# Patient Record
Sex: Male | Born: 1995 | Race: Black or African American | Hispanic: No | Marital: Single | Smoking: Former smoker
Health system: Southern US, Community
[De-identification: ages and names within clinical notes are randomized; demographics above are authoritative.]

## PROBLEM LIST (undated history)

## (undated) DIAGNOSIS — L0291 Cutaneous abscess, unspecified: Secondary | ICD-10-CM

## (undated) DIAGNOSIS — L309 Dermatitis, unspecified: Secondary | ICD-10-CM

## (undated) DIAGNOSIS — F909 Attention-deficit hyperactivity disorder, unspecified type: Secondary | ICD-10-CM

---
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
## 2015-02-28 ENCOUNTER — Encounter (HOSPITAL_COMMUNITY): Payer: Self-pay

## 2015-02-28 ENCOUNTER — Emergency Department (HOSPITAL_COMMUNITY)
Admission: EM | Admit: 2015-02-28 | Discharge: 2015-02-28 | Disposition: A | Discharge status: Home / Self Care | Payer: Medicaid Other | Attending: Emergency Medicine | Admitting: Emergency Medicine

## 2015-02-28 DIAGNOSIS — Z792 Long term (current) use of antibiotics: Secondary | ICD-10-CM | POA: Diagnosis not present

## 2015-02-28 DIAGNOSIS — Z720 Tobacco use: Secondary | ICD-10-CM | POA: Insufficient documentation

## 2015-02-28 DIAGNOSIS — J209 Acute bronchitis, unspecified: Secondary | ICD-10-CM | POA: Diagnosis not present

## 2015-02-28 DIAGNOSIS — R05 Cough: Secondary | ICD-10-CM | POA: Diagnosis present

## 2015-02-28 DIAGNOSIS — F909 Attention-deficit hyperactivity disorder, unspecified type: Secondary | ICD-10-CM | POA: Insufficient documentation

## 2015-02-28 DIAGNOSIS — Z79899 Other long term (current) drug therapy: Secondary | ICD-10-CM | POA: Insufficient documentation

## 2015-02-28 DIAGNOSIS — Z872 Personal history of diseases of the skin and subcutaneous tissue: Secondary | ICD-10-CM | POA: Diagnosis not present

## 2015-02-28 DIAGNOSIS — J40 Bronchitis, not specified as acute or chronic: Secondary | ICD-10-CM

## 2015-02-28 MED ORDER — AZITHROMYCIN 250 MG PO TABS: 250.0000 mg | ORAL_TABLET | Freq: Every day | ORAL | Status: AC

## 2015-02-28 MED ORDER — ALBUTEROL SULFATE HFA 108 (90 BASE) MCG/ACT IN AERS: 1.0000 | INHALATION_SPRAY | Freq: Four times a day (QID) | RESPIRATORY_TRACT | Status: DC | PRN

## 2015-02-28 MED ORDER — PREDNISONE 20 MG PO TABS
60.0000 mg | ORAL_TABLET | Freq: Once | ORAL | Status: AC
  Administered 2015-02-28: 60 mg via ORAL
  Filled 2015-02-28: qty 3

## 2015-02-28 MED ORDER — IPRATROPIUM-ALBUTEROL 0.5-2.5 (3) MG/3ML IN SOLN
3.0000 mL | Freq: Once | RESPIRATORY_TRACT | Status: AC
  Administered 2015-02-28: 3 mL via RESPIRATORY_TRACT
  Filled 2015-02-28: qty 3

## 2015-02-28 MED ORDER — PREDNISONE 20 MG PO TABS: 40.0000 mg | ORAL_TABLET | Freq: Every day | ORAL | Status: DC

## 2015-02-28 NOTE — Discharge Instructions (Signed)
Take azithromycin as directed until gone. Take prednisone as directed until gone. Use inhaler as needed for wheezing. Refer to attached documents for more information.

## 2015-02-28 NOTE — ED Notes (Signed)
Treatment complete.

## 2015-02-28 NOTE — ED Provider Notes (Signed)
CSN: [id number]     Arrival date & time 02/28/15  0910 History  By signing my name below, I, Elon Spanner, attest that this documentation has been prepared under the direction and in the presence of [facility], PA-C. Electronically Signed: Elon Spanner ED Scribe. 02/28/2015. 9:16 AM.   Chief Complaint  Patient presents with  . Cough   The history is provided by the patient. No language interpreter was used.    HPI Comments: Allen Hayes is a 19 y.o. male who presents to the Emergency Department complaining of constant, unchanged mild SOB with associated nonproductive cough onset 1 week ago.  Associated symptoms include CP only with coughing.  He reports he was seen for that same complaint 1 week ago in [facility] Emergency Department where he received a negative CXR.  He was told his complaint was due to asthma, however, he had never been dx'd with asthma previously.  He reports a family hx of asthma in the aunt.  He denies fever.     Past Medical History  Diagnosis Date  . Eczema   . ADHD (attention deficit hyperactivity disorder)   . Abscess     mul;tiple skin abscesses   No past surgical history on file. No family history on file. Social History  Substance Use Topics  . Smoking status: Current Every Day Smoker    Types: Cigarettes  . Smokeless tobacco: Not on file  . Alcohol Use: Yes     Comment: occ    Review of Systems  Constitutional: Negative for fever.  Respiratory: Positive for cough and shortness of breath.   All other systems reviewed and are negative.     Allergies  Review of patient's allergies indicates no known allergies.  Home Medications   Prior to Admission medications   Medication Sig Start Date End Date Taking? Authorizing Provider  cephALEXin (KEFLEX) 500 MG capsule Take 1 capsule (500 mg total) by mouth 4 (four) times daily. 12/03/13   Junius Finner, PA-C  cetirizine (ZYRTEC) 10 MG tablet Take 1 tablet (10 mg total) by mouth daily.  11/01/12   Nicole Pisciotta, PA-C  Emollient (EUCERIN) lotion Apply 1 Bottle topically as needed for dry skin.    Historical Provider, MD  lisdexamfetamine (VYVANSE) 30 MG capsule Take 30 mg by mouth daily.    Historical Provider, MD  oxyCODONE-acetaminophen (PERCOCET/ROXICET) 5-325 MG per tablet Take 1-2 tablets by mouth every 6 (six) hours as needed for moderate pain or severe pain. 12/03/13   Junius Finner, PA-C  traMADol (ULTRAM) 50 MG tablet Take 1 tablet (50 mg total) by mouth every 6 (six) hours as needed. 07/28/14   Kathrynn Speed, PA-C   There were no vitals taken for this visit. Physical Exam  Constitutional: He is oriented to person, place, and time. He appears well-developed and well-nourished. No distress.  HENT:  Head: Normocephalic and atraumatic.  Eyes: Conjunctivae and EOM are normal.  Neck: Neck supple. No tracheal deviation present.  Cardiovascular: Normal rate and regular rhythm.  Exam reveals no gallop and no friction rub.   No murmur heard. Pulmonary/Chest: Effort normal. No respiratory distress. He has wheezes. He has no rales. He exhibits no tenderness.  Inspiratory and expiratory rhonchi in all lung fields. Expiratory wheezing.   Abdominal: Soft. There is no tenderness.  Musculoskeletal: Normal range of motion.  Neurological: He is alert and oriented to person, place, and time.  Speech is goal-oriented. Moves limbs without ataxia.   Skin: Skin is warm and dry.  Psychiatric: He has a normal mood and affect. His behavior is normal.  Nursing note and vitals reviewed.   ED Course  Procedures (including critical care time)  DIAGNOSTIC STUDIES: Oxygen Saturation is 95% on RA, adequate by my interpretation.    COORDINATION OF CARE:  9:45 AM Discussed treatment plan with patient at bedside.  Patient acknowledges and agrees with plan.    Labs Review Labs Reviewed - No data to display  Imaging Review No results found. I have personally reviewed and evaluated these  images and lab results as part of my medical decision-making.   EKG Interpretation None      MDM   Final diagnoses:  Bronchitis    11:02 AM Patient's lung sounds improved with duoneb. Patient will be treated with prednisone taper, azithromycin, and albuterol inhaler. Vitals stable and patient afebrile. Patient stable for discharge.   I personally performed the services described in this documentation, which was scribed in my presence. The recorded information has been reviewed and is accurate.    Emilia BeckKaitlyn Mayu Ronk, PA-C 02/28/15 1104  Gilda Creasehristopher J Pollina, MD 02/28/15 1531

## 2015-02-28 NOTE — ED Notes (Signed)
Pt. Developed a congested cough last night.  Denies any fevers, n/v/d.  Does have asthma, but does not have any inhalers.  Pt. Is allergic to cats and dogs and has recently been around them.  Skin is warm and dry.

## 2016-03-16 ENCOUNTER — Encounter (HOSPITAL_COMMUNITY): Payer: Self-pay | Admitting: Family Medicine

## 2016-03-16 ENCOUNTER — Emergency Department (HOSPITAL_COMMUNITY)
Admission: EM | Admit: 2016-03-16 | Discharge: 2016-03-16 | Disposition: A | Discharge status: Home / Self Care | Payer: Medicaid Other | Attending: Emergency Medicine | Admitting: Emergency Medicine

## 2016-03-16 ENCOUNTER — Emergency Department (HOSPITAL_COMMUNITY): Payer: Medicaid Other

## 2016-03-16 DIAGNOSIS — Z87891 Personal history of nicotine dependence: Secondary | ICD-10-CM | POA: Diagnosis not present

## 2016-03-16 DIAGNOSIS — F909 Attention-deficit hyperactivity disorder, unspecified type: Secondary | ICD-10-CM | POA: Insufficient documentation

## 2016-03-16 DIAGNOSIS — J4521 Mild intermittent asthma with (acute) exacerbation: Secondary | ICD-10-CM

## 2016-03-16 DIAGNOSIS — R0602 Shortness of breath: Secondary | ICD-10-CM | POA: Diagnosis present

## 2016-03-16 MED ORDER — PREDNISONE 20 MG PO TABS
60.0000 mg | ORAL_TABLET | Freq: Once | ORAL | Status: AC
  Administered 2016-03-16: 60 mg via ORAL
  Filled 2016-03-16: qty 3

## 2016-03-16 MED ORDER — PREDNISONE 20 MG PO TABS: ORAL_TABLET | ORAL | 0 refills | Status: DC

## 2016-03-16 MED ORDER — ALBUTEROL SULFATE (2.5 MG/3ML) 0.083% IN NEBU
5.0000 mg | INHALATION_SOLUTION | Freq: Once | RESPIRATORY_TRACT | Status: AC
  Administered 2016-03-16: 5 mg via RESPIRATORY_TRACT
  Filled 2016-03-16: qty 6

## 2016-03-16 MED ORDER — ALBUTEROL SULFATE HFA 108 (90 BASE) MCG/ACT IN AERS: 1.0000 | INHALATION_SPRAY | Freq: Four times a day (QID) | RESPIRATORY_TRACT | 0 refills | Status: AC | PRN

## 2016-03-16 NOTE — ED Provider Notes (Signed)
[facility]-EMERGENCY DEPT Provider Note   CSN: [id number] Arrival date & time: 03/16/16  4098 By signing my name below, I, Allen Hayes, attest that this documentation has been prepared under the direction and in the presence of Allen Trueba, MD . Electronically Signed: Levon Hayes, Scribe. 03/16/2016. 3:43 AM.   History   Chief Complaint Chief Complaint  Patient presents with  . Shortness of Breath    HPI Allen Hayes is a 20 y.o. male who presents to the Emergency Department complaining of asthma exacerbation which began at 9:30 yesterday morning. Pt states he was standing near someone who was smoking a cigarette, causing him to wheeze. No alleviating or modifying factors noted. He states he has been to the hospital for the same multiple times recently. Pt denies any other symptoms and has no other complaints at this time.   The history is provided by the patient. No language interpreter was used.  Wheezing   This is a recurrent problem. The current episode started yesterday. The problem occurs constantly. The problem has been rapidly improving. Pertinent negatives include no chest pain, no fever, no abdominal pain, no coryza and no cough. The problem's precipitants include smoke. He has tried nothing for the symptoms. The treatment provided no relief. He has had no prior hospitalizations. He has had prior ED visits. He has had no prior ICU admissions. His past medical history does not include pneumonia or PE.   Past Medical History:  Diagnosis Date  . Abscess    mul;tiple skin abscesses  . ADHD (attention deficit hyperactivity disorder)   . Eczema    There are no active problems to display for this patient.   History reviewed. No pertinent surgical history.   Home Medications    Prior to Admission medications   Medication Sig Start Date End Date Taking? Authorizing Provider  albuterol (PROVENTIL HFA;VENTOLIN HFA) 108 (90 BASE) MCG/ACT inhaler Inhale 1-2 puffs into the  lungs every 6 (six) hours as needed for wheezing or shortness of breath. 02/28/15   Kaitlyn Szekalski, PA-C  azithromycin (ZITHROMAX Z-PAK) 250 MG tablet Take 1 tablet (250 mg total) by mouth daily. 500mg  PO day 1, then 250mg  PO days 205 02/28/15   Kaitlyn Szekalski, PA-C  cephALEXin (KEFLEX) 500 MG capsule Take 1 capsule (500 mg total) by mouth 4 (four) times daily. 12/03/13   Junius Finner, PA-C  cetirizine (ZYRTEC) 10 MG tablet Take 1 tablet (10 mg total) by mouth daily. 11/01/12   Nicole Pisciotta, PA-C  Emollient (EUCERIN) lotion Apply 1 Bottle topically as needed for dry skin.    Historical Provider, MD  lisdexamfetamine (VYVANSE) 30 MG capsule Take 30 mg by mouth daily.    Historical Provider, MD  oxyCODONE-acetaminophen (PERCOCET/ROXICET) 5-325 MG per tablet Take 1-2 tablets by mouth every 6 (six) hours as needed for moderate pain or severe pain. 12/03/13   Junius Finner, PA-C  predniSONE (DELTASONE) 20 MG tablet Take 2 tablets (40 mg total) by mouth daily. Take 40 mg by mouth daily for 3 days, then 20mg  by mouth daily for 3 days, then 10mg  daily for 3 days 02/28/15   Emilia Beck, PA-C  traMADol (ULTRAM) 50 MG tablet Take 1 tablet (50 mg total) by mouth every 6 (six) hours as needed. 07/28/14   Kathrynn Speed, PA-C    Family History History reviewed. No pertinent family history.  Social History Social History  Substance Use Topics  . Smoking status: Former Smoker    Types: Cigarettes  . Smokeless tobacco: Never  Used  . Alcohol use Yes     Comment: occ    Allergies   Patient has no known allergies.  Review of Systems Review of Systems  Constitutional: Negative for fever.  Respiratory: Positive for wheezing. Negative for cough, shortness of breath and stridor.   Cardiovascular: Negative for chest pain, palpitations and leg swelling.  Gastrointestinal: Negative for abdominal pain.  All other systems reviewed and are negative.  Physical Exam Updated Vital Signs BP 121/80    Pulse 92   Temp 97.8 F (36.6 C) (Oral)   Resp 14   Ht 5\' 10"  (1.778 m)   Wt 230 lb (104.3 kg)   SpO2 94%   BMI 33.00 kg/m   Physical Exam  Constitutional: He is oriented to person, place, and time. He appears well-developed and well-nourished. No distress.  HENT:  Head: Normocephalic and atraumatic.  Mouth/Throat: Oropharynx is clear and moist. No oropharyngeal exudate.  Moist mucous membranes   Eyes: Pupils are equal, round, and reactive to light. Right conjunctiva is injected. Left conjunctiva is injected.  Neck: Normal range of motion. Neck supple. No JVD present.  Trachea midline No bruit  Cardiovascular: Normal rate, regular rhythm, normal heart sounds and intact distal pulses.   Pulmonary/Chest: Effort normal and breath sounds normal. No stridor. No respiratory distress. He has no wheezes. He has no rales.  Abdominal: Soft. Bowel sounds are normal. He exhibits no distension and no mass. There is no tenderness. There is no guarding.  Musculoskeletal: Normal range of motion.  All compartments are soft  Neurological: He is alert and oriented to person, place, and time. He has normal reflexes.  Skin: Skin is warm and dry. Capillary refill takes less than 2 seconds.  Psychiatric: He has a normal mood and affect. His behavior is normal.  Nursing note and vitals reviewed.  ED Treatments / Results   Vitals:   03/16/16 0245 03/16/16 0314  BP: 107/81 121/80  Pulse: 99 92  Resp: 22 14  Temp: 97.8 F (36.6 C)     DIAGNOSTIC STUDIES: Oxygen Saturation is 94% on RA, normal by my interpretation.    COORDINATION OF CARE:  3:44 AM Will order prednisone. Discussed treatment plan with pt at bedside and pt agreed to plan.     EKG Interpretation  Date/Time:  Tuesday March 16 2016 03:13:22 EST Ventricular Rate:  92 PR Interval:    QRS Duration: 88 QT Interval:  335 QTC Calculation: 415 R Axis:   82 Text Interpretation:  Sinus rhythm Confirmed by [facility]  MD, Adriena Manfre  ([id number]) on 03/16/2016 3:42:34 AM       Radiology Dg Chest 2 View  Result Date: 03/16/2016 CLINICAL DATA:  Shortness of breath and wheezing for 24 hours, worse this morning. EXAM: CHEST  2 VIEW COMPARISON:  06/08/2014 FINDINGS: The lungs are clear. The pulmonary vasculature is normal. Heart size is normal. Hilar and mediastinal contours are unremarkable. There is no pleural effusion. IMPRESSION: No active cardiopulmonary disease. Electronically Signed   By: Ellery Plunkaniel R Mitchell M.D.   On: 03/16/2016 03:10    Procedures Procedures (including critical care time)  Medications Ordered in ED Medications  albuterol (PROVENTIL) (2.5 MG/3ML) 0.083% nebulizer solution 5 mg (5 mg Nebulization Given 03/16/16 0327)  predniSONE (DELTASONE) tablet 60 mg (60 mg Oral Given 03/16/16 0413)      Final Clinical Impressions(s) / ED Diagnoses  Asthma attack:All questions answered to patient's satisfaction. Based on history and exam patient has been appropriately medically screened and emergency conditions  excluded. Patient is stable for discharge at this time. Follow up with your PMD for recheck in 2 days and strict return precautions given. If symptoms are not improved or progressing call hand surgery to schedule a follow up.    I personally performed the services described in this documentation, which was scribed in my presence. The recorded information has been reviewed and is accurate.      Cy BlamerApril Taziah Difatta, MD 03/16/16 [phone]

## 2016-03-16 NOTE — ED Triage Notes (Signed)
Patient is complaining of shortness of breath since 9:30 yesterday morning. Pt was standing around another person who was smoking a cigarette.

## 2016-03-16 NOTE — ED Notes (Signed)
Patient transported to X-ray 

## 2018-01-20 ENCOUNTER — Other Ambulatory Visit: Payer: Self-pay

## 2018-01-20 ENCOUNTER — Emergency Department (HOSPITAL_COMMUNITY)
Admission: EM | Admit: 2018-01-20 | Discharge: 2018-01-20 | Disposition: A | Discharge status: Home / Self Care | Payer: Self-pay | Attending: Emergency Medicine | Admitting: Emergency Medicine

## 2018-01-20 ENCOUNTER — Emergency Department (HOSPITAL_COMMUNITY): Payer: Self-pay

## 2018-01-20 ENCOUNTER — Encounter (HOSPITAL_COMMUNITY): Payer: Self-pay

## 2018-01-20 DIAGNOSIS — Z87891 Personal history of nicotine dependence: Secondary | ICD-10-CM | POA: Insufficient documentation

## 2018-01-20 DIAGNOSIS — R062 Wheezing: Secondary | ICD-10-CM

## 2018-01-20 DIAGNOSIS — J069 Acute upper respiratory infection, unspecified: Secondary | ICD-10-CM | POA: Insufficient documentation

## 2018-01-20 MED ORDER — ALBUTEROL SULFATE (2.5 MG/3ML) 0.083% IN NEBU
5.0000 mg | INHALATION_SOLUTION | Freq: Once | RESPIRATORY_TRACT | Status: AC
  Administered 2018-01-20: 5 mg via RESPIRATORY_TRACT
  Filled 2018-01-20: qty 6

## 2018-01-20 MED ORDER — PREDNISONE 20 MG PO TABS
60.0000 mg | ORAL_TABLET | Freq: Once | ORAL | Status: AC
  Administered 2018-01-20: 60 mg via ORAL
  Filled 2018-01-20: qty 3

## 2018-01-20 MED ORDER — PREDNISONE 20 MG PO TABS: ORAL_TABLET | ORAL | 0 refills | Status: AC

## 2018-01-20 MED ORDER — ALBUTEROL SULFATE HFA 108 (90 BASE) MCG/ACT IN AERS
2.0000 | INHALATION_SPRAY | RESPIRATORY_TRACT | Status: DC | PRN
  Administered 2018-01-20: 2 via RESPIRATORY_TRACT
  Filled 2018-01-20: qty 6.7

## 2018-01-20 NOTE — ED Provider Notes (Addendum)
[facility]-EMERGENCY DEPT Provider Note   CSN: [id number] Arrival date & time: 01/20/18  1617     History   Chief Complaint Chief Complaint  Patient presents with  . Shortness of Breath    HPI Allen Hayes is a 22 y.o. male.  Patient is a 22 year old male with history of ADHD and eczema who presents with cough and wheezing.  He has had cold symptoms since yesterday with nasal congestion coughing and wheezing.  He was feeling short of breath on arrival.  He has some discomfort in the center of his chest when he breathes but no other chest pain.  No leg pain or swelling.  He states he has had wheezing before with colds.  He has no diagnosis of asthma but had an inhaler at home which she tried to use but it was apparently out.  He denies any recent fevers.     Past Medical History:  Diagnosis Date  . Abscess    mul;tiple skin abscesses  . ADHD (attention deficit hyperactivity disorder)   . Eczema     There are no active problems to display for this patient.   History reviewed. No pertinent surgical history.      Home Medications    Prior to Admission medications   Medication Sig Start Date End Date Taking? Authorizing Provider  albuterol (PROVENTIL HFA;VENTOLIN HFA) 108 (90 Base) MCG/ACT inhaler Inhale 1-2 puffs into the lungs every 6 (six) hours as needed for wheezing or shortness of breath. Patient not taking: Reported on 01/20/2018 03/16/16   Palumbo, April, MD  azithromycin (ZITHROMAX Z-PAK) 250 MG tablet Take 1 tablet (250 mg total) by mouth daily. 500mg  PO day 1, then 250mg  PO days 205 Patient not taking: Reported on 03/16/2016 02/28/15   Emilia Beck, PA-C  cephALEXin (KEFLEX) 500 MG capsule Take 1 capsule (500 mg total) by mouth 4 (four) times daily. Patient not taking: Reported on 03/16/2016 12/03/13   Lurene Shadow, PA-C  cetirizine (ZYRTEC) 10 MG tablet Take 1 tablet (10 mg total) by mouth daily. Patient not taking: Reported on  03/16/2016 11/01/12   Pisciotta, Joni Reining, PA-C  oxyCODONE-acetaminophen (PERCOCET/ROXICET) 5-325 MG per tablet Take 1-2 tablets by mouth every 6 (six) hours as needed for moderate pain or severe pain. Patient not taking: Reported on 03/16/2016 12/03/13   Lurene Shadow, PA-C  predniSONE (DELTASONE) 20 MG tablet 2 tabs po daily x 4 days 01/20/18   Rolan Bucco, MD  traMADol (ULTRAM) 50 MG tablet Take 1 tablet (50 mg total) by mouth every 6 (six) hours as needed. Patient not taking: Reported on 03/16/2016 07/28/14   Kathrynn Speed, PA-C    Family History History reviewed. No pertinent family history.  Social History Social History   Tobacco Use  . Smoking status: Former Smoker    Types: Cigarettes  . Smokeless tobacco: Never Used  Substance Use Topics  . Alcohol use: Yes    Comment: occ  . Drug use: Yes    Frequency: 7.0 times per week    Types: Marijuana    Comment: Last used: 1.5 months ago     Allergies   Patient has no known allergies.   Review of Systems Review of Systems  Constitutional: Negative for chills, diaphoresis, fatigue and fever.  HENT: Positive for congestion and rhinorrhea. Negative for sneezing.   Eyes: Negative.   Respiratory: Positive for cough, shortness of breath and wheezing. Negative for chest tightness.   Cardiovascular: Negative for chest pain and  leg swelling.  Gastrointestinal: Negative for abdominal pain, blood in stool, diarrhea, nausea and vomiting.  Genitourinary: Negative for difficulty urinating, flank pain, frequency and hematuria.  Musculoskeletal: Negative for arthralgias and back pain.  Skin: Negative for rash.  Neurological: Negative for dizziness, speech difficulty, weakness, numbness and headaches.     Physical Exam Updated Vital Signs BP (!) 141/82   Pulse 93   Temp 98.8 F (37.1 C) (Oral)   Resp 17   Ht 5\' 11"  (1.803 m)   Wt 111.1 kg   SpO2 98%   BMI 34.17 kg/m   Physical Exam  Constitutional: He is oriented to person,  place, and time. He appears well-developed and well-nourished.  HENT:  Head: Normocephalic and atraumatic.  Eyes: Pupils are equal, round, and reactive to light.  Neck: Normal range of motion. Neck supple.  Cardiovascular: Normal rate, regular rhythm and normal heart sounds.  Pulmonary/Chest: Effort normal. No respiratory distress. He has wheezes. He has no rales. He exhibits no tenderness.  Abdominal: Soft. Bowel sounds are normal. There is no tenderness. There is no rebound and no guarding.  Musculoskeletal: Normal range of motion. He exhibits no edema.  No edema or calf tenderness  Lymphadenopathy:    He has no cervical adenopathy.  Neurological: He is alert and oriented to person, place, and time.  Skin: Skin is warm and dry. No rash noted.  Psychiatric: He has a normal mood and affect.     ED Treatments / Results  Labs (all labs ordered are listed, but only abnormal results are displayed) Labs Reviewed - No data to display  EKG EKG Interpretation  Date/Time:  Friday January 20 2018 16:40:59 EDT Ventricular Rate:  106 PR Interval:    QRS Duration: 74 QT Interval:  310 QTC Calculation: 412 R Axis:   81 Text Interpretation:  Sinus tachycardia Consider right atrial enlargement Nonspecific T abnormalities, lateral leads since last tracing no significant change Confirmed by Rolan Bucco [phone]) on 01/20/2018 7:23:11 PM   Radiology Dg Chest 2 View  Result Date: 01/20/2018 CLINICAL DATA:  Shortness of breath this morning. Productive cough with clear to yellow sputum. Nonsmoker. EXAM: CHEST - 2 VIEW COMPARISON:  03/16/2016 and 06/08/2014. FINDINGS: The heart size and mediastinal contours are normal. The lungs are clear. There is no pleural effusion or pneumothorax. No acute osseous findings are identified. IMPRESSION: Stable chest.  No active cardiopulmonary process. Electronically Signed   By: Carey Bullocks M.D.   On: 01/20/2018 17:40    Procedures Procedures (including  critical care time)  Medications Ordered in ED Medications  predniSONE (DELTASONE) tablet 60 mg (has no administration in time range)  albuterol (PROVENTIL HFA;VENTOLIN HFA) 108 (90 Base) MCG/ACT inhaler 2 puff (has no administration in time range)  albuterol (PROVENTIL) (2.5 MG/3ML) 0.083% nebulizer solution 5 mg (5 mg Nebulization Given 01/20/18 1644)     Initial Impression / Assessment and Plan / ED Course  I have reviewed the triage vital signs and the nursing notes.  Pertinent labs & imaging results that were available during my care of the patient were reviewed by me and considered in my medical decision making (see chart for details).     Patient is a 22 year old male who presents with wheezing and cough.  He has URI symptoms.  There is no evidence of pneumonia on review of CXR.  He received a nebulizer treatment and feels much better.  He still has some residual wheezing but no increased work of breathing.  No hypoxia.  He states his breathing is much better.  He has no ongoing pleuritic pain or hypoxia which would be more suggestive of PE.  He was discharged home in good condition.  He was dispensed an albuterol inhaler to use 2 puffs every 4-6 hours for the next few days and then as needed after that.  He was started on a prednisone 5-day burst.  He was encouraged to establish care with a PCP.  Return precautions were given.  Final Clinical Impressions(s) / ED Diagnoses   Final diagnoses:  Viral upper respiratory tract infection  Wheezing    ED Discharge Orders         Ordered    predniSONE (DELTASONE) 20 MG tablet     01/20/18 1925           Rolan BuccoBelfi, Faustino Luecke, MD 01/20/18 1931    Rolan BuccoBelfi, Latorya Bautch, MD 01/20/18 1934

## 2018-01-20 NOTE — ED Triage Notes (Signed)
Paient c/o SOB this AM. Patient c/o productive cough with clear to yellow sputum.

## 2019-08-05 IMAGING — DX DG CHEST 2V
2 series · 2 of 2 positions shown · non-contrast
Comparison: 03/16/2016 and 06/08/2014.

CLINICAL DATA: Shortness of breath this morning. Productive cough
with clear to yellow sputum. Nonsmoker.

EXAM:
CHEST - 2 VIEW

[chest pa]
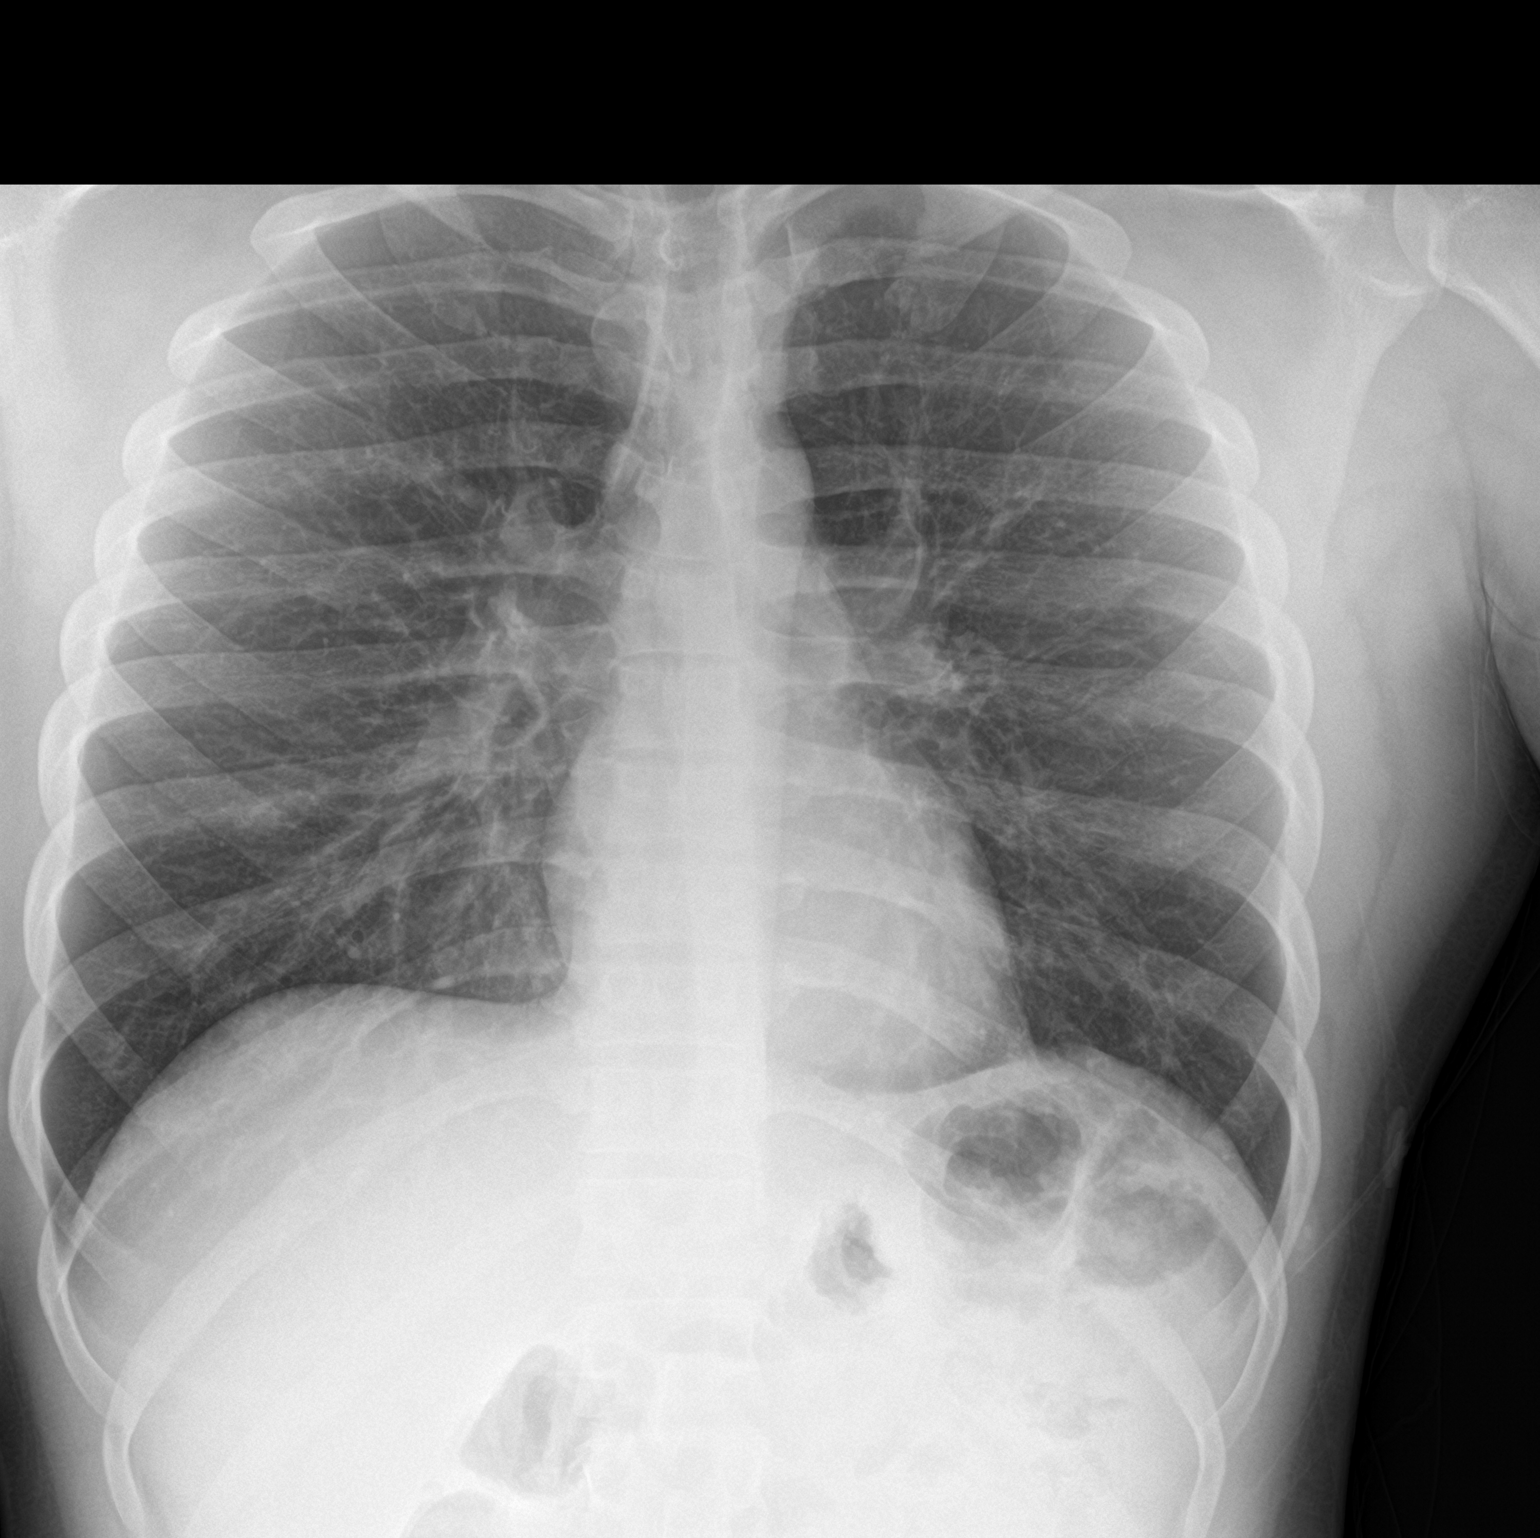

[chest lat]
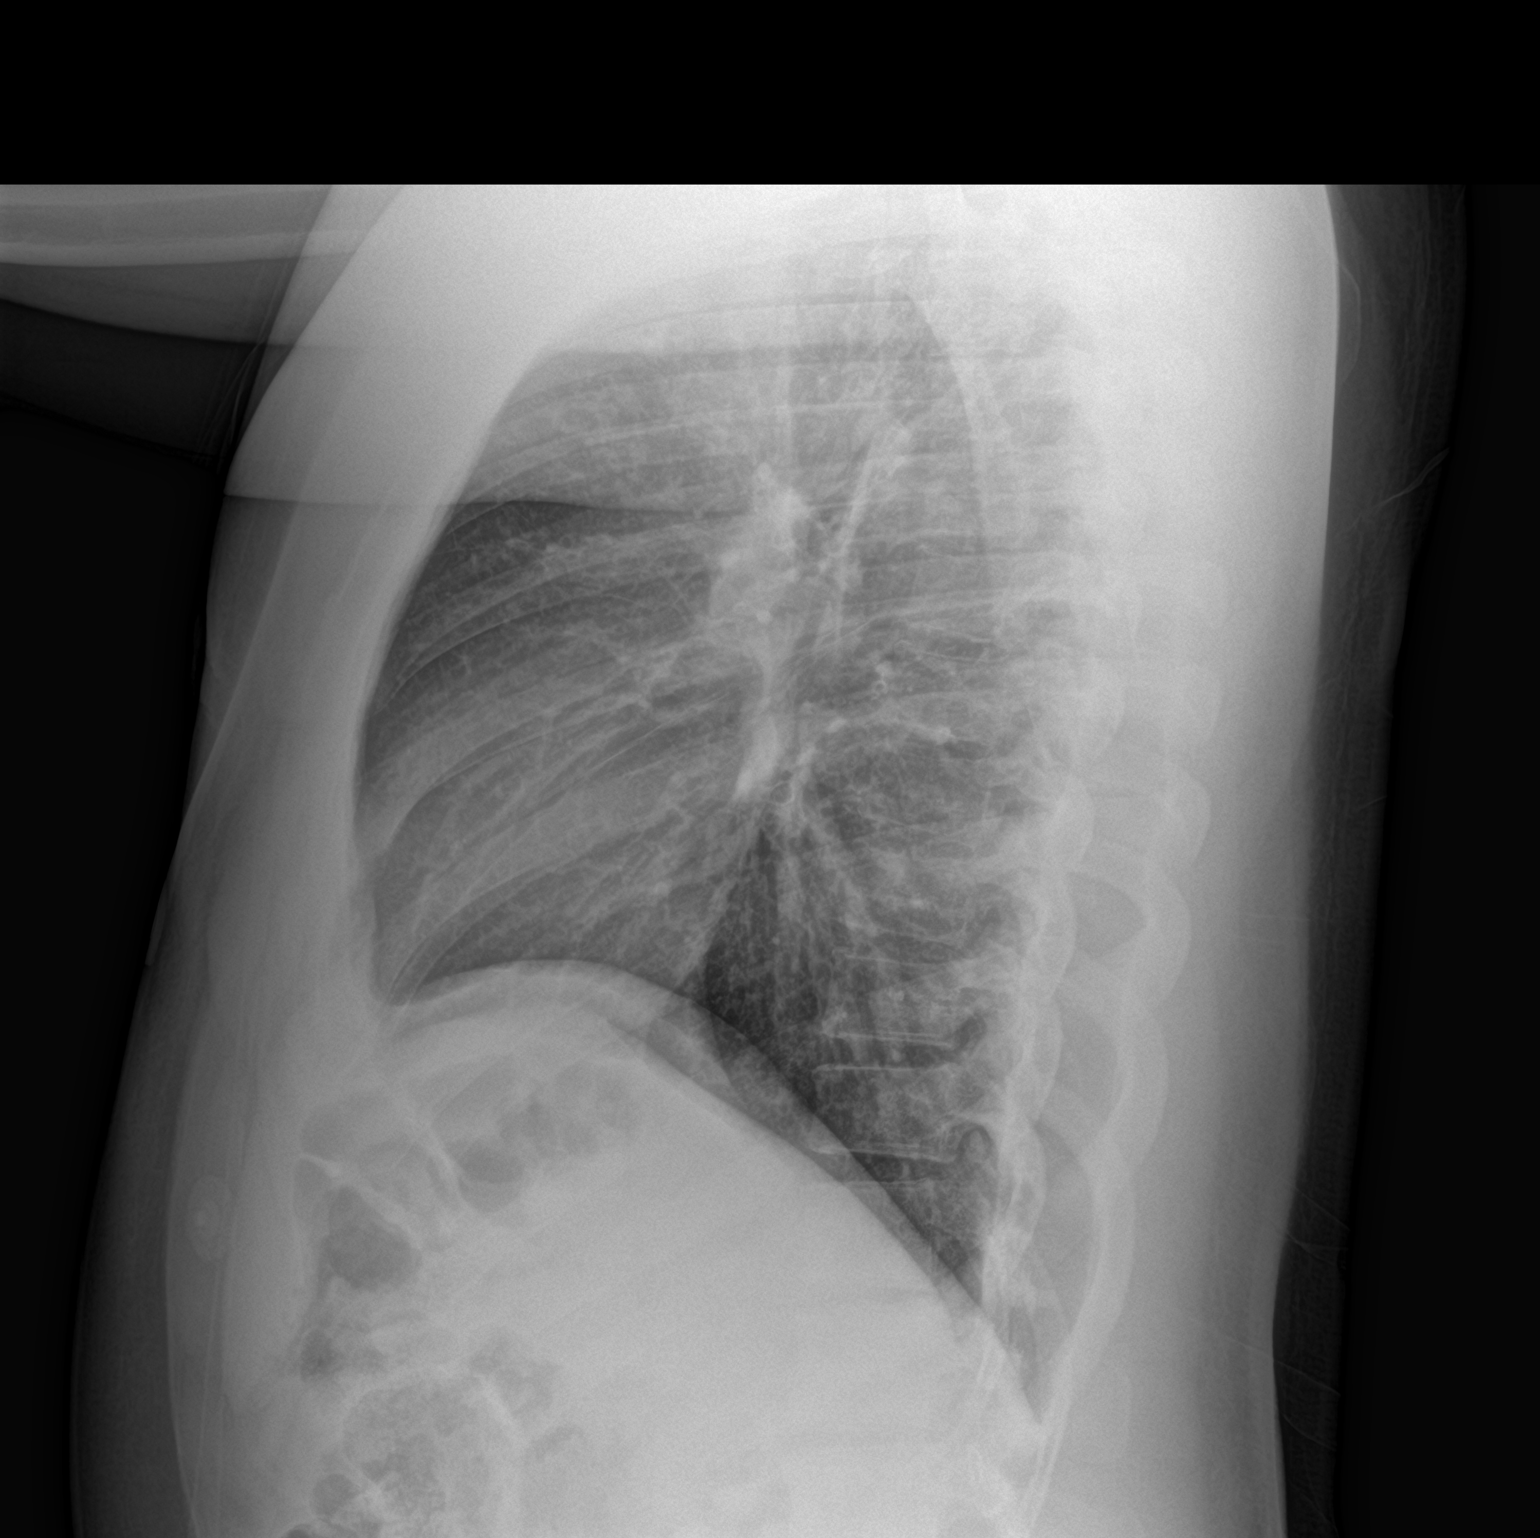

[2 of 2 positions shown; findings below may reference images not displayed]

FINDINGS: The heart size and mediastinal contours are normal. The lungs are
clear. There is no pleural effusion or pneumothorax. No acute
osseous findings are identified.
IMPRESSION: Stable chest.  No active cardiopulmonary process.
